# Patient Record
Sex: Male | Born: 1965 | Race: White | Hispanic: No | Marital: Married | Smoking: Never smoker
Health system: Southern US, Community
[De-identification: ages and names within clinical notes are randomized; demographics above are authoritative.]

## PROBLEM LIST (undated history)

## (undated) DIAGNOSIS — E7800 Pure hypercholesterolemia, unspecified: Secondary | ICD-10-CM

## (undated) HISTORY — PX: VEIN SURGERY: SHX48

---

## 1997-06-07 ENCOUNTER — Ambulatory Visit (HOSPITAL_COMMUNITY): Admission: RE | Admit: 1997-06-07 | Discharge: 1997-06-07 | Payer: Self-pay | Admitting: Gastroenterology

## 1999-04-23 ENCOUNTER — Encounter: Admission: RE | Admit: 1999-04-23 | Discharge: 1999-04-23 | Payer: Self-pay | Admitting: *Deleted

## 1999-04-23 ENCOUNTER — Encounter: Payer: Self-pay | Admitting: *Deleted

## 2001-12-09 ENCOUNTER — Ambulatory Visit (HOSPITAL_COMMUNITY): Admission: RE | Admit: 2001-12-09 | Discharge: 2001-12-09 | Payer: Self-pay | Admitting: *Deleted

## 2001-12-14 ENCOUNTER — Ambulatory Visit (HOSPITAL_COMMUNITY): Admission: RE | Admit: 2001-12-14 | Discharge: 2001-12-14 | Payer: Self-pay | Admitting: *Deleted

## 2010-03-21 ENCOUNTER — Encounter: Payer: Self-pay | Admitting: Cardiovascular Disease

## 2010-03-27 ENCOUNTER — Encounter (INDEPENDENT_AMBULATORY_CARE_PROVIDER_SITE_OTHER): Payer: Self-pay | Admitting: *Deleted

## 2010-03-27 ENCOUNTER — Ambulatory Visit: Admit: 2010-03-27 | Payer: Self-pay

## 2010-03-27 DIAGNOSIS — R079 Chest pain, unspecified: Secondary | ICD-10-CM | POA: Insufficient documentation

## 2010-04-03 ENCOUNTER — Other Ambulatory Visit (HOSPITAL_COMMUNITY): Payer: Self-pay | Admitting: Cardiology

## 2010-04-04 NOTE — Letter (Signed)
Summary: Generic Letter  Architectural technologist, Main Office  [address]   [place], [place] [address]   Phone: [phone]  Fax: [phone]        March 27, 2010 MRN: [id number]    Kevin Richard [address] [place], [place]  [address]    Dear [name],   You are scheduled for Cardiac CTA on 04/05/2010 at 2pm.  PLEASE ARRIVE AT 1PM AT [facility]MISSION OFFICE LOCATED ON THE FIRST FLOOR NEAR THE GIFT SHOP. PATIENT NEEDS TO BE FASTING 4 HOUR PRIOR TO THE APPOINTMENT.   Sincerely,   Merita Norton Lloyd-Fate

## 2010-04-04 NOTE — Miscellaneous (Signed)
Summary: Orders Update  Clinical Lists Changes  Problems: Added new problem of CHEST PAIN UNSPECIFIED (ICD-786.50) Orders: Added new Referral order of Cardiac CTA (Cardiac CTA) - Signed

## 2010-04-10 ENCOUNTER — Ambulatory Visit (HOSPITAL_COMMUNITY): Payer: Self-pay

## 2010-04-18 ENCOUNTER — Other Ambulatory Visit (HOSPITAL_COMMUNITY): Payer: Self-pay | Admitting: Cardiology

## 2010-04-18 ENCOUNTER — Other Ambulatory Visit: Payer: Self-pay | Admitting: Cardiovascular Disease

## 2010-04-18 ENCOUNTER — Ambulatory Visit (HOSPITAL_COMMUNITY)
Admission: RE | Admit: 2010-04-18 | Discharge: 2010-04-18 | Disposition: A | Discharge status: Home / Self Care | Payer: Self-pay | Source: Ambulatory Visit | Attending: Cardiology | Admitting: Cardiology

## 2010-04-18 DIAGNOSIS — R079 Chest pain, unspecified: Secondary | ICD-10-CM

## 2010-04-18 MED ORDER — IOHEXOL 350 MG/ML SOLN: 90.0000 mL | Freq: Once | INTRAVENOUS | Status: AC | PRN

## 2010-04-18 NOTE — Letter (Signed)
Summary: [facility]  [facility]   Imported By: Lenard Forth 04/06/2010 09:34:51  _____________________________________________________________________  External Attachment:    Type:   Image     Comment:   External Document

## 2012-08-14 IMAGING — CT CT HEART MORP W/ CTA COR W/ SCORE W/ CA W/CM &/OR W/O CM
2 of 4 series · 11 of 20 positions shown, 12 images · non-contrast
Comparison: None.

***ADDENDUM*** CREATED: 04/18/2010 [DATE]

OVER-READ INTERPRETATION - CT CHEST
The following report is an over-read performed by radiologist Dr.
[DATE].  This over-read does not include interpretation of
cardiac or coronary anatomy or pathology.  The CTA interpretation
by the cardiologist is attached.
INDICATION: Chest Pain
PROTOCOL: The patient was scanned on a Philips 256 scanner.  10mg
of iv lopresser was used.  Average heart rate during the scan was
50 bpm.  SL nitro was given.  A prospectively triggered scan was
done using idose with 5% phase tolerance centered around 78% or the
R-R cycle.  80cc of contrast was used for CTA .  The images were
sent to [facility] [facility] [facility] workstation for reconstruction using MIP, VRT
and MPR modes.

[Series 4: soft w/o · axial · non-contrast · 0.79mm/px · z∈[-267,-130]mm · 3 of 56 slices shown, 4 images]
[im 1/56  vessel]
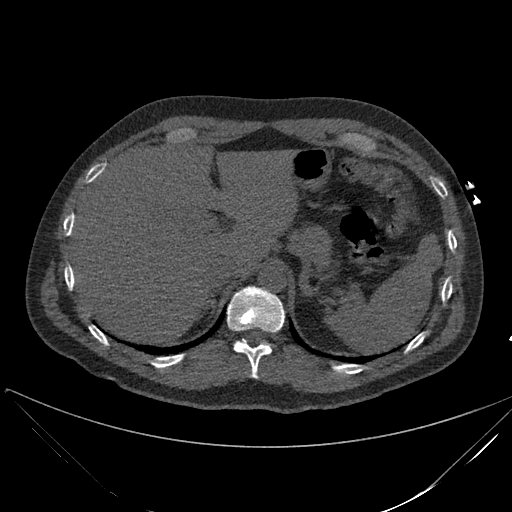
[im 1/56  lung]
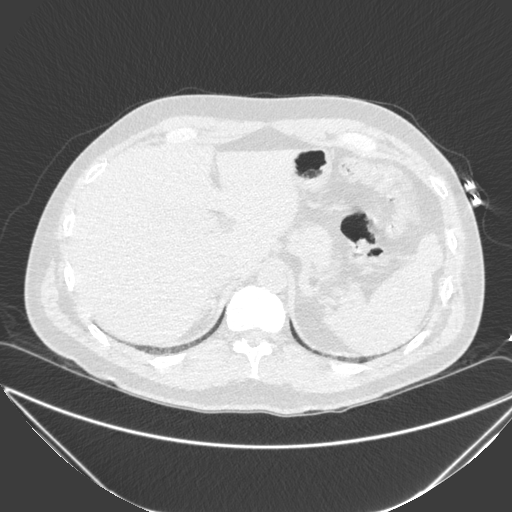
[im 28/56  vessel]
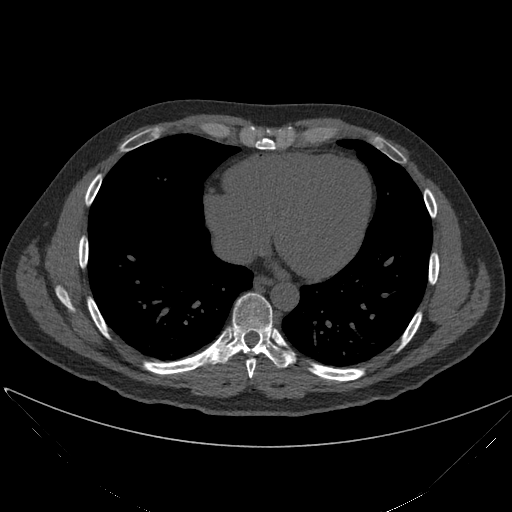
[im 56/56  vessel]
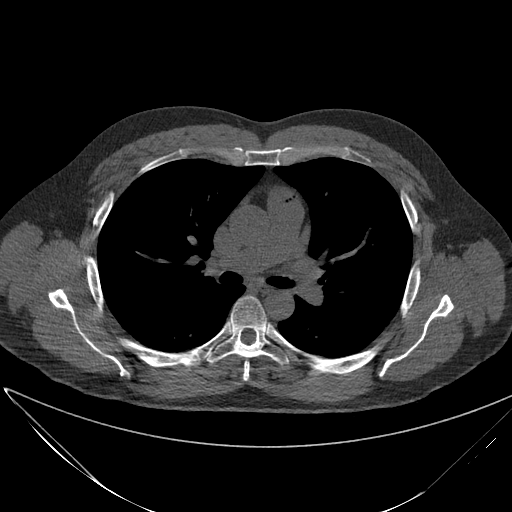

[Series 7: w/ edge cor., 78.0% · axial · 0.49mm/px · z∈[-247,-142]mm · 8 of 276 slices shown]
[im 22/276  lung]
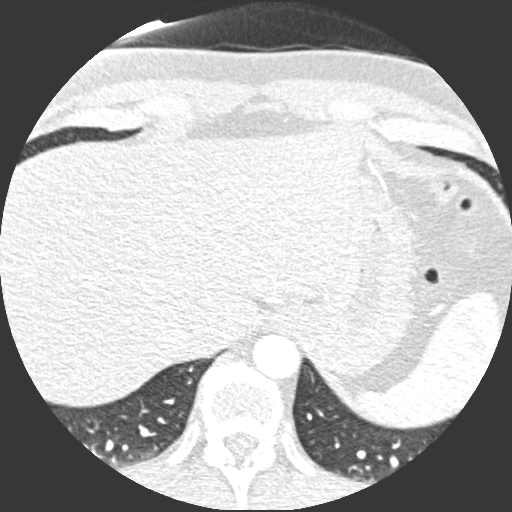
[im 64/276  lung]
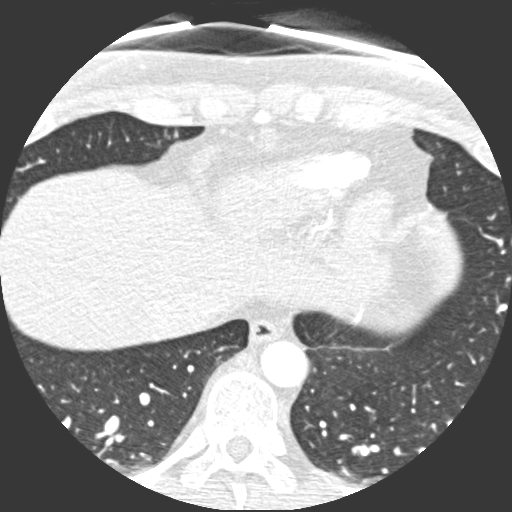
[im 85/276  lung]
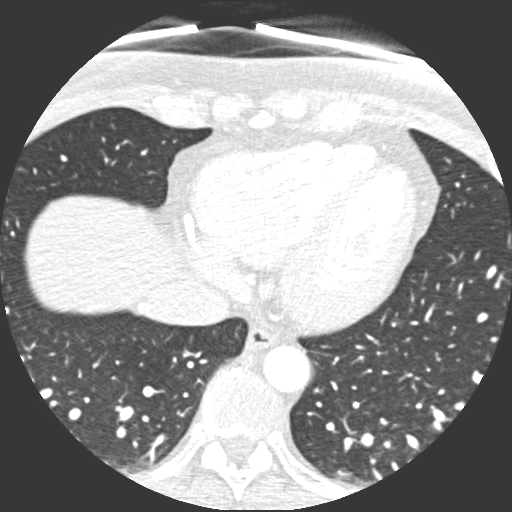
[im 127/276  lung]
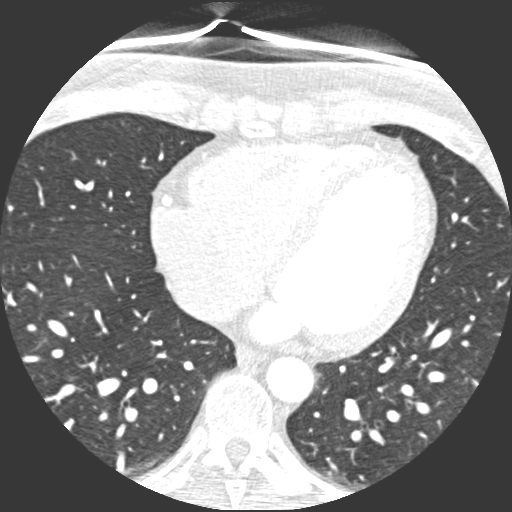
[im 149/276  lung]
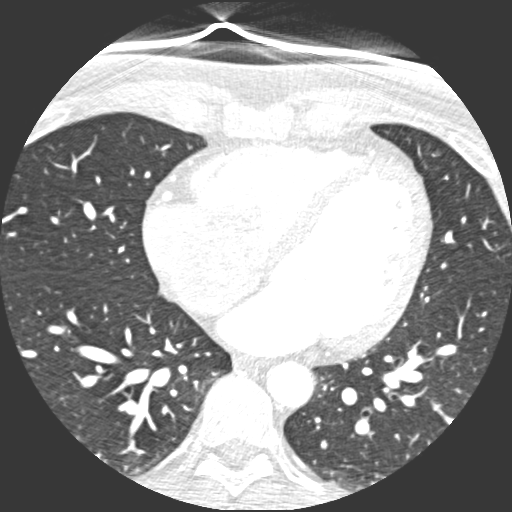
[im 191/276  lung]
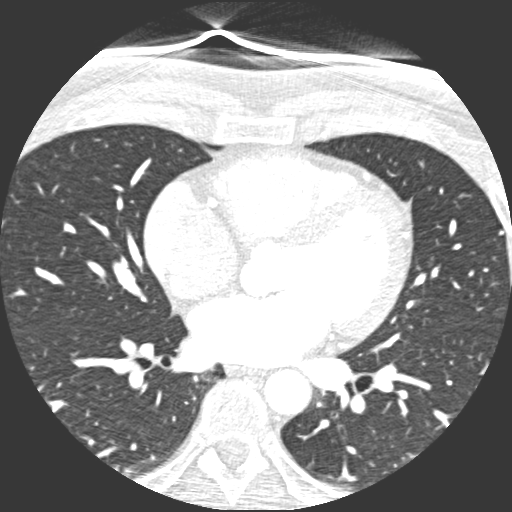
[im 212/276  lung]
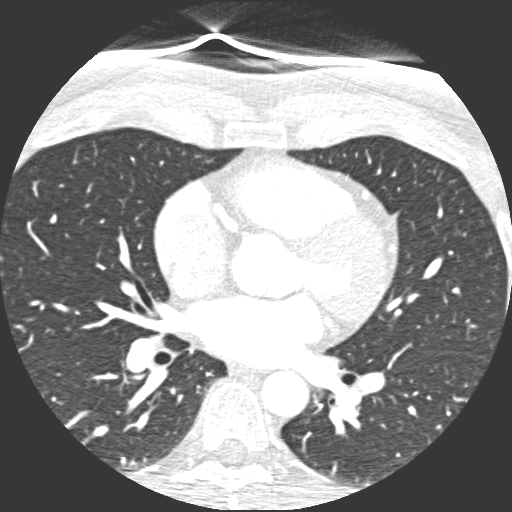
[im 254/276  lung]
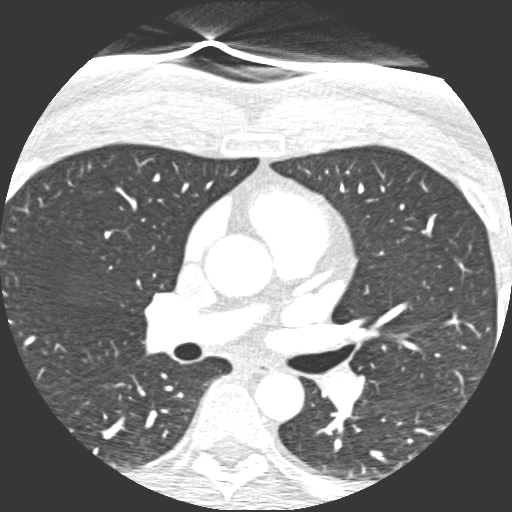

[11 of 20 positions shown; findings below may reference images not displayed]

FINDINGS: On the lung window images, no parenchymal abnormality is
seen.  No lung nodule is noted.  There is no evidence of pleural
effusion.  Minimal dependent basilar atelectasis is present
posteriorly.

On initial unenhanced images no mediastinal or hilar adenopathy is
seen.

After contrast administration, the pulmonary arteries and thoracic
aorta opacify with no acute abnormality noted.  The ascending aorta
measures 30 mm in maximum diameter.  The descending thoracic aorta
is normal in caliber.  No bony abnormality is seen.
IMPRESSION: No extracardiac abnormality is noted.

***END ADDENDUM*** SIGNED BY: Eshiley Madeiros, M.D.

Cardiac CT:
FINDINGS: Calcium Score: 0

Coronary CTA: Right dominant No anomalies.  LAD normal, D1 normal,
D2 normal. Circumflex normal, OM1 small normal, OM2 small normal.
RCA large and dominant. Normal

[REDACTED] to review lung and soft tissue findings.  See
separate addendum.  No abnormalities seen on my review.
IMPRESSION: Normal right dominant coronary arteries.  Calcium Score 0

Copy to Dr. Faxx Tiger

## 2015-12-17 ENCOUNTER — Ambulatory Visit (HOSPITAL_COMMUNITY)
Admission: EM | Admit: 2015-12-17 | Discharge: 2015-12-17 | Disposition: A | Discharge status: Home / Self Care | Payer: Managed Care, Other (non HMO) | Attending: Family Medicine | Admitting: Family Medicine

## 2015-12-17 ENCOUNTER — Ambulatory Visit (INDEPENDENT_AMBULATORY_CARE_PROVIDER_SITE_OTHER): Payer: Managed Care, Other (non HMO)

## 2015-12-17 ENCOUNTER — Encounter (HOSPITAL_COMMUNITY): Payer: Self-pay | Admitting: Emergency Medicine

## 2015-12-17 DIAGNOSIS — S82892A Other fracture of left lower leg, initial encounter for closed fracture: Secondary | ICD-10-CM

## 2015-12-17 DIAGNOSIS — S93402A Sprain of unspecified ligament of left ankle, initial encounter: Secondary | ICD-10-CM

## 2015-12-17 HISTORY — DX: Pure hypercholesterolemia, unspecified: E78.00
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
## 2015-12-17 NOTE — Discharge Instructions (Signed)
Elevate foot above heart level as much as possible.  Can weightbear as tolerated with crutches.  Use over the counter ibuprofen with food as needed/directed.  Ice off and on as needed.

## 2015-12-20 ENCOUNTER — Encounter (INDEPENDENT_AMBULATORY_CARE_PROVIDER_SITE_OTHER): Payer: Self-pay | Admitting: Orthopedic Surgery

## 2015-12-20 ENCOUNTER — Ambulatory Visit (INDEPENDENT_AMBULATORY_CARE_PROVIDER_SITE_OTHER): Payer: Managed Care, Other (non HMO) | Admitting: Orthopedic Surgery

## 2015-12-20 VITALS — Ht 68.0 in | Wt 183.0 lb

## 2015-12-20 DIAGNOSIS — S93412A Sprain of calcaneofibular ligament of left ankle, initial encounter: Secondary | ICD-10-CM

## 2015-12-20 DIAGNOSIS — M25572 Pain in left ankle and joints of left foot: Secondary | ICD-10-CM | POA: Diagnosis not present
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
## 2022-01-15 ENCOUNTER — Ambulatory Visit: Payer: BC Managed Care – PPO | Admitting: Internal Medicine

## 2022-01-15 ENCOUNTER — Encounter: Payer: Self-pay | Admitting: Internal Medicine

## 2022-01-15 VITALS — BP 117/76 | HR 64 | Resp 15 | Ht 68.0 in | Wt 198.4 lb

## 2022-01-15 DIAGNOSIS — I10 Essential (primary) hypertension: Secondary | ICD-10-CM | POA: Insufficient documentation

## 2022-01-15 DIAGNOSIS — E782 Mixed hyperlipidemia: Secondary | ICD-10-CM | POA: Insufficient documentation

## 2022-01-15 DIAGNOSIS — Z8249 Family history of ischemic heart disease and other diseases of the circulatory system: Secondary | ICD-10-CM

## 2022-01-15 NOTE — Progress Notes (Signed)
Primary Physician/Referring:  Pcp, No  Patient ID: Kevin Richard, male    DOB: [birth year], 56 y.o.   MRN: [id number]  Chief Complaint  Patient presents with   Edema   HPI:    Kevin Richard  is a 56 y.o. male with past medical history significant for hyperlipidemia and chronic venous insufficiency who is here to establish care with cardiology.  Patient was here about 15 years ago where he got a coronary CTA which showed a coronary calcium score of 0.  Patient is just very nervous as his grandfather died very suddenly at a very young age and there is extensive family history of heart disease.  He has noticed that he cannot workout on the treadmill as much or as well as he used to.  He states that he gets really short of breath and very easily winded now.  Patient would like to get this checked out because this is new for him.  He used to be able to run much farther than he can now.  He is also not doing as much cardio at the gym anymore.  He denies palpitations, diaphoresis, syncope, orthopnea, claudication, edema, PND.  Past Medical History:  Diagnosis Date   Hypercholesteremia    Past Surgical History:  Procedure Laterality Date   VEIN SURGERY Left    sees Dr Georgina Quint at [facility]   Family History  Problem Relation Age of Onset   Hypertension Mother    Heart disease Mother    Dementia Mother    Hypertension Father    Heart disease Sister    Hyperlipidemia Brother     Social History   Tobacco Use   Smoking status: Never   Smokeless tobacco: Never  Substance Use Topics   Alcohol use: Yes    Alcohol/week: 1.0 standard drink of alcohol    Types: 1 Shots of liquor per week    Comment: Social   Marital Status: Married  ROS  Review of Systems  Cardiovascular:  Positive for dyspnea on exertion.  Respiratory:  Positive for shortness of breath.    Objective  Blood pressure 117/76, pulse 64, resp. rate 15, height _0  (1.727 m), weight 198 lb 6.4 oz (90 kg), SpO2 98  %. Body mass index is 30.17 kg/m.     01/15/2022    1:00 PM 12/20/2015    2:22 PM 12/17/2015    6:32 PM  Vitals with BMI  Height _1  _2    Weight 198 lbs 6 oz 183 lbs   BMI 32.20 25.4   Systolic 270  623  Diastolic 76  65  Pulse 64  64     Physical Exam Vitals reviewed.  HENT:     Head: Normocephalic and atraumatic.  Cardiovascular:     Rate and Rhythm: Normal rate and regular rhythm.     Pulses: Normal pulses.     Heart sounds: Normal heart sounds. No murmur heard. Pulmonary:     Effort: Pulmonary effort is normal.     Breath sounds: Normal breath sounds.  Abdominal:     General: Bowel sounds are normal.  Musculoskeletal:     Right lower leg: No edema.     Left lower leg: No edema.  Skin:    General: Skin is warm and dry.  Neurological:     Mental Status: He is alert.     Medications and allergies  No Known Allergies   Medication list after today's encounter   Current Outpatient  Medications:    cholecalciferol (VITAMIN D3) 25 MCG (1000 UNIT) tablet, Take 1,000 Units by mouth daily., Disp: , Rfl:    Ginseng 250 MG CAPS, Take 250 mg by mouth daily as needed., Disp: , Rfl:    Multiple Vitamin (MULTIVITAMIN WITH MINERALS) TABS tablet, Take 1 tablet by mouth daily., Disp: , Rfl:    Omega 3 1200 MG CAPS, Take 1,200 mg by mouth daily., Disp: , Rfl:    rosuvastatin (CRESTOR) 10 MG tablet, Take 10 mg by mouth daily., Disp: , Rfl:   Laboratory examination:   No results found for: "NA", "K", "CO2", "GLUCOSE", "BUN", "CREATININE", "CALCIUM", "EGFR", "GFRNONAA"      No data to display             No data to display          Lipid Panel No results for input(s): "CHOL", "TRIG", "LDLCALC", "VLDL", "HDL", "CHOLHDL", "LDLDIRECT" in the last 8760 hours.  HEMOGLOBIN A1C No results found for: "HGBA1C", "MPG" TSH No results for input(s): "TSH" in the last 8760 hours.  External labs:     Radiology:    Cardiac Studies:   03/2010 CCTA: CCS=0    EKG:    01/15/2022: normal sinus rhythm, normal R wave progression. No evidence of ischemia   Assessment     ICD-10-CM   1. Family history of early CAD  Z82.49 EKG 12-Lead    2. Mixed hyperlipidemia  E78.2        Orders Placed This Encounter  Procedures   EKG 12-Lead    No orders of the defined types were placed in this encounter.   There are no discontinued medications.   Recommendations:   HARVIS MABUS is a 56 y.o.  male with HLD  Family history of early CAD Coronary CTA, echocardiogram and stress test ordered   Mixed hyperlipidemia Continue current cardiac medications.   Essential hypertension Controlled without medication Follow-up in 3 months or sooner if needed     Floydene Flock, DO, [facility]  01/15/2022, 1:32 PM Office: [phone] Pager: [phone]

## 2022-01-25 ENCOUNTER — Ambulatory Visit: Payer: BC Managed Care – PPO

## 2022-01-25 DIAGNOSIS — E782 Mixed hyperlipidemia: Secondary | ICD-10-CM

## 2022-01-25 DIAGNOSIS — Z8249 Family history of ischemic heart disease and other diseases of the circulatory system: Secondary | ICD-10-CM

## 2022-01-25 DIAGNOSIS — I10 Essential (primary) hypertension: Secondary | ICD-10-CM

## 2022-02-11 ENCOUNTER — Ambulatory Visit: Payer: BC Managed Care – PPO

## 2022-02-11 DIAGNOSIS — E782 Mixed hyperlipidemia: Secondary | ICD-10-CM

## 2022-02-11 DIAGNOSIS — Z8249 Family history of ischemic heart disease and other diseases of the circulatory system: Secondary | ICD-10-CM

## 2022-02-11 DIAGNOSIS — I10 Essential (primary) hypertension: Secondary | ICD-10-CM

## 2022-02-14 NOTE — Progress Notes (Signed)
Patient aware.

## 2022-02-21 ENCOUNTER — Other Ambulatory Visit: Payer: Self-pay

## 2022-02-21 DIAGNOSIS — I10 Essential (primary) hypertension: Secondary | ICD-10-CM

## 2022-02-22 ENCOUNTER — Telehealth: Payer: Self-pay | Admitting: Internal Medicine

## 2022-02-22 NOTE — Telephone Encounter (Signed)
Given normal echo and nuclear stress test, I do not advise proceeding with coronary CTA. Patient wanted full work-up as he is nervous about his family history but he is agreeable with canceling the CCTA given these normal results. follow-up as scheduled

## 2022-03-04 ENCOUNTER — Ambulatory Visit (HOSPITAL_COMMUNITY): Payer: BC Managed Care – PPO

## 2022-04-17 ENCOUNTER — Ambulatory Visit: Payer: BC Managed Care – PPO | Admitting: Internal Medicine
